# Patient Record
Sex: Female | Born: 1990 | Race: Black or African American | Hispanic: No | Marital: Single | Smoking: Never smoker
Health system: Southern US, Community
[De-identification: ages and names within clinical notes are randomized; demographics above are authoritative.]

## PROBLEM LIST (undated history)

## (undated) ENCOUNTER — Inpatient Hospital Stay: Payer: Self-pay

## (undated) DIAGNOSIS — R569 Unspecified convulsions: Secondary | ICD-10-CM

## (undated) DIAGNOSIS — J45909 Unspecified asthma, uncomplicated: Secondary | ICD-10-CM

## (undated) DIAGNOSIS — E063 Autoimmune thyroiditis: Secondary | ICD-10-CM

## (undated) DIAGNOSIS — F4310 Post-traumatic stress disorder, unspecified: Secondary | ICD-10-CM

## (undated) HISTORY — DX: Autoimmune thyroiditis: E06.3

## (undated) HISTORY — PX: NO PAST SURGERIES: SHX2092

---

## 2016-01-28 ENCOUNTER — Encounter: Payer: Self-pay | Admitting: *Deleted

## 2016-01-28 ENCOUNTER — Emergency Department: Payer: Self-pay

## 2016-01-28 DIAGNOSIS — J45901 Unspecified asthma with (acute) exacerbation: Secondary | ICD-10-CM | POA: Insufficient documentation

## 2016-01-28 MED ORDER — IPRATROPIUM-ALBUTEROL 0.5-2.5 (3) MG/3ML IN SOLN
RESPIRATORY_TRACT | Status: AC
  Filled 2016-01-28: qty 3

## 2016-01-28 MED ORDER — IPRATROPIUM-ALBUTEROL 0.5-2.5 (3) MG/3ML IN SOLN
3.0000 mL | Freq: Once | RESPIRATORY_TRACT | Status: AC
  Administered 2016-01-28: 3 mL via RESPIRATORY_TRACT

## 2016-01-28 NOTE — ED Triage Notes (Signed)
Pt reports she has asthma and lost her inhaler.  Pt has a cough and wheezing for 1 hour.

## 2016-01-28 NOTE — ED Notes (Signed)
Breathing treatment given in triage.  

## 2016-01-29 ENCOUNTER — Emergency Department
Admission: EM | Admit: 2016-01-29 | Discharge: 2016-01-29 | Disposition: A | Discharge status: Home / Self Care | Payer: Self-pay | Attending: Emergency Medicine | Admitting: Emergency Medicine

## 2016-01-29 DIAGNOSIS — J45901 Unspecified asthma with (acute) exacerbation: Secondary | ICD-10-CM

## 2016-01-29 MED ORDER — PREDNISONE 20 MG PO TABS: ORAL_TABLET | ORAL | 0 refills | Status: DC

## 2016-01-29 MED ORDER — PREDNISONE 20 MG PO TABS
60.0000 mg | ORAL_TABLET | Freq: Once | ORAL | Status: AC
  Administered 2016-01-29: 60 mg via ORAL
  Filled 2016-01-29: qty 3

## 2016-01-29 MED ORDER — ALBUTEROL SULFATE HFA 108 (90 BASE) MCG/ACT IN AERS: 2.0000 | INHALATION_SPRAY | RESPIRATORY_TRACT | 0 refills | Status: AC | PRN

## 2016-01-29 MED ORDER — IPRATROPIUM-ALBUTEROL 0.5-2.5 (3) MG/3ML IN SOLN
3.0000 mL | Freq: Once | RESPIRATORY_TRACT | Status: AC
  Administered 2016-01-29: 3 mL via RESPIRATORY_TRACT
  Filled 2016-01-29: qty 3

## 2016-01-29 NOTE — ED Provider Notes (Signed)
[facility] Emergency Department Provider Note   ____________________________________________   First MD Initiated Contact with Patient 01/29/16 [phone]     (approximate)  I have reviewed the triage vital signs and the nursing notes.   HISTORY  Chief Complaint Wheezing    HPI Madison Bruce is a 25 y.o. female who presents to the ED from home with a chief complaint of asthma exacerbation. Patient has a history of asthma, never hospitalized, who lost her inhaler. Reports dry cough and wheezing tonight. Denies associated fever, chills, chest pain, shortness of breath, abdominal pain, nausea, vomiting, diarrhea. Denies recent travel or trauma. Denies OCP use. Nothing makes her symptoms better or worse.   Past medical history Asthma  There are no active problems to display for this patient.   No past surgical history on file.  Prior to Admission medications   Medication Sig Start Date End Date Taking? Authorizing Provider  albuterol (PROVENTIL HFA;VENTOLIN HFA) 108 (90 Base) MCG/ACT inhaler Inhale 2 puffs into the lungs every 4 (four) hours as needed for wheezing or shortness of breath. 01/29/16   Irean Hong, MD  predniSONE (DELTASONE) 20 MG tablet 3 tablets daily x 4 days 01/29/16   Irean Hong, MD    Allergies Review of patient's allergies indicates no known allergies.  No family history on file.  Social History Social History  Substance Use Topics  . Smoking status: Never Smoker  . Smokeless tobacco: Never Used  . Alcohol use No    Review of Systems  Constitutional: No fever/chills. Eyes: No visual changes. ENT: No sore throat. Cardiovascular: Denies chest pain. Respiratory: Positive for nonproductive cough and wheezing. Denies shortness of breath. Gastrointestinal: No abdominal pain.  No nausea, no vomiting.  No diarrhea.  No constipation. Genitourinary: Negative for dysuria. Musculoskeletal: Negative for back pain. Skin: Negative for  rash. Neurological: Negative for headaches, focal weakness or numbness.  10-point ROS otherwise negative.  ____________________________________________   PHYSICAL EXAM:  VITAL SIGNS: ED Triage Vitals  Enc Vitals Group     BP 01/28/16 2320 115/67     Pulse Rate 01/28/16 2320 89     Resp 01/28/16 2320 20     Temp 01/28/16 2320 98.7 F (37.1 C)     Temp Source 01/28/16 2320 Oral     SpO2 01/28/16 2320 98 %     Weight 01/28/16 2322 180 lb (81.6 kg)     Height 01/28/16 2322 5\' 9"  (1.753 m)     Head Circumference --      Peak Flow --      Pain Score 01/28/16 2323 6     Pain Loc --      Pain Edu? --      Excl. in GC? --     Constitutional: Alert and oriented. Well appearing and in no acute distress. Eyes: Conjunctivae are normal. PERRL. EOMI. Head: Atraumatic. Nose: No congestion/rhinnorhea. Mouth/Throat: Mucous membranes are moist.  Oropharynx non-erythematous. Neck: No stridor.   Cardiovascular: Normal rate, regular rhythm. Grossly normal heart sounds.  Good peripheral circulation. Respiratory: Normal respiratory effort.  No retractions. Lungs diminished bibasilarly. No wheezing. Gastrointestinal: Soft and nontender. No distention. No abdominal bruits. No CVA tenderness. Musculoskeletal: No lower extremity tenderness nor edema.  No joint effusions. Neurologic:  Normal speech and language. No gross focal neurologic deficits are appreciated. No gait instability. Skin:  Skin is warm, dry and intact. No rash noted. Psychiatric: Mood and affect are normal. Speech and behavior are normal.  ____________________________________________  LABS (all labs ordered are listed, but only abnormal results are displayed)  Labs Reviewed - No data to display ____________________________________________  EKG  None ____________________________________________  RADIOLOGY  Chest 2 view (view by me, interpreted per Dr. Manus GunningEhinger): No active cardiopulmonary  disease. ____________________________________________   PROCEDURES  Procedure(s) performed: None  Procedures  Critical Care performed: No  ____________________________________________   INITIAL IMPRESSION / ASSESSMENT AND PLAN / ED COURSE  Pertinent labs & imaging results that were available during my care of the patient were reviewed by me and considered in my medical decision making (see chart for details).  25 year old female with a history of asthma who presents with exacerbation. Patient received DuoNeb while in triage. No wheezing and lungs but diminished bibasilarly. Will administer second DuoNeb, add prednisone and reassess.  Clinical Course  Comment By Time  Aeration improved. No wheezing. Room air saturations 100%. Strict return precautions given. Patient's spouse verbalize understanding and agree with plan of care. Irean HongJade J Sung, MD 09/14 [phone]     ____________________________________________   FINAL CLINICAL IMPRESSION(S) / ED DIAGNOSES  Final diagnoses:  Asthma exacerbation      NEW MEDICATIONS STARTED DURING THIS VISIT:  New Prescriptions   ALBUTEROL (PROVENTIL HFA;VENTOLIN HFA) 108 (90 BASE) MCG/ACT INHALER    Inhale 2 puffs into the lungs every 4 (four) hours as needed for wheezing or shortness of breath.   PREDNISONE (DELTASONE) 20 MG TABLET    3 tablets daily x 4 days     Note:  This document was prepared using Dragon voice recognition software and may include unintentional dictation errors.    Irean HongJade J Sung, MD 01/29/16 [phone]

## 2016-01-29 NOTE — Discharge Instructions (Signed)
1. Take prednisone 60 mg daily ×4 days. °2. You may use albuterol inhaler 2 puffs every 4 hours as needed for wheezing. °3. Return to the ER for worsening symptoms, persistent vomiting, difficulty breathing or other concerns. °

## 2016-05-13 ENCOUNTER — Other Ambulatory Visit: Payer: Self-pay | Admitting: Nurse Practitioner

## 2016-05-13 ENCOUNTER — Ambulatory Visit
Admission: RE | Admit: 2016-05-13 | Discharge: 2016-05-13 | Disposition: A | Discharge status: Home / Self Care | Payer: Medicaid Other | Source: Ambulatory Visit | Attending: Nurse Practitioner | Admitting: Nurse Practitioner

## 2016-05-13 DIAGNOSIS — Z3A16 16 weeks gestation of pregnancy: Secondary | ICD-10-CM | POA: Diagnosis not present

## 2016-05-13 DIAGNOSIS — Z3689 Encounter for other specified antenatal screening: Secondary | ICD-10-CM | POA: Diagnosis not present

## 2016-05-13 DIAGNOSIS — Z3402 Encounter for supervision of normal first pregnancy, second trimester: Secondary | ICD-10-CM

## 2016-05-13 LAB — HM PAP SMEAR: HM Pap smear: NEGATIVE

## 2016-05-14 LAB — OB RESULTS CONSOLE GC/CHLAMYDIA
Chlamydia: NEGATIVE
GC PROBE AMP, GENITAL: NEGATIVE

## 2016-05-14 LAB — OB RESULTS CONSOLE HEPATITIS B SURFACE ANTIGEN: Hepatitis B Surface Ag: NEGATIVE

## 2016-05-14 LAB — OB RESULTS CONSOLE HIV ANTIBODY (ROUTINE TESTING): HIV: NONREACTIVE

## 2016-05-14 LAB — OB RESULTS CONSOLE RPR: RPR: NONREACTIVE

## 2016-05-17 NOTE — L&D Delivery Note (Signed)
Delivery Note At 7:01 AM a viable female was delivered via Vaginal, Spontaneous Delivery (Presentation: OA).  APGAR: 9, 9; weight pending .   Placenta status: delivered spontaneously, intaact.  Cord: 3VC with the following complications: none.  Cord pH: n/a  Anesthesia: none  Episiotomy: None Lacerations: None Suture Repair: n/a Est. Blood Loss (mL): 200  Mom to postpartum.  Baby to Couplet care / Skin to Skin.  Called to see patient.  Mom pushed to deliver a viable female infant.  The head followed by shoulders, which delivered without difficulty, and the rest of the body.  No nuchal cord noted.  Baby to mom's chest.  Cord clamped and cut after > 1 min delay.  No cord blood obtained.  Placenta delivered spontaneously, intact, with a 3-vessel cord.  No vaginal, cervical, or perineal lacerations.  All counts correct.  Hemostasis obtained with IV pitocin and fundal massage. EBL 200 mL.     Thomasene MohairStephen Niall Illes 10/25/2016, 7:17 AM

## 2016-07-07 DIAGNOSIS — F324 Major depressive disorder, single episode, in partial remission: Secondary | ICD-10-CM | POA: Insufficient documentation

## 2016-08-03 DIAGNOSIS — Z8751 Personal history of pre-term labor: Secondary | ICD-10-CM | POA: Insufficient documentation

## 2016-09-25 ENCOUNTER — Observation Stay
Admission: EM | Admit: 2016-09-25 | Discharge: 2016-09-25 | Disposition: A | Discharge status: Home / Self Care | Payer: Medicaid Other | Attending: Obstetrics and Gynecology | Admitting: Obstetrics and Gynecology

## 2016-09-25 DIAGNOSIS — R252 Cramp and spasm: Secondary | ICD-10-CM | POA: Insufficient documentation

## 2016-09-25 DIAGNOSIS — Z3A34 34 weeks gestation of pregnancy: Secondary | ICD-10-CM | POA: Insufficient documentation

## 2016-09-25 DIAGNOSIS — O26893 Other specified pregnancy related conditions, third trimester: Principal | ICD-10-CM | POA: Insufficient documentation

## 2016-09-25 HISTORY — DX: Unspecified convulsions: R56.9

## 2016-09-25 HISTORY — DX: Post-traumatic stress disorder, unspecified: F43.10

## 2016-09-25 LAB — URINALYSIS, ROUTINE W REFLEX MICROSCOPIC
BILIRUBIN URINE: NEGATIVE
Glucose, UA: NEGATIVE mg/dL
HGB URINE DIPSTICK: NEGATIVE
KETONES UR: NEGATIVE mg/dL
Leukocytes, UA: NEGATIVE
NITRITE: NEGATIVE
PH: 6 (ref 5.0–8.0)
Protein, ur: NEGATIVE mg/dL
Specific Gravity, Urine: 1.012 (ref 1.005–1.030)

## 2016-09-25 LAB — URINE DRUG SCREEN, QUALITATIVE (ARMC ONLY)
AMPHETAMINES, UR SCREEN: NOT DETECTED
BARBITURATES, UR SCREEN: NOT DETECTED
Benzodiazepine, Ur Scrn: NOT DETECTED
CANNABINOID 50 NG, UR ~~LOC~~: NOT DETECTED
COCAINE METABOLITE, UR ~~LOC~~: NOT DETECTED
MDMA (ECSTASY) UR SCREEN: NOT DETECTED
METHADONE SCREEN, URINE: NOT DETECTED
Opiate, Ur Screen: NOT DETECTED
PHENCYCLIDINE (PCP) UR S: NOT DETECTED
TRICYCLIC, UR SCREEN: NOT DETECTED

## 2016-09-25 MED ORDER — ACETAMINOPHEN 325 MG PO TABS: 650.0000 mg | ORAL_TABLET | ORAL | Status: DC | PRN

## 2016-09-25 NOTE — Discharge Summary (Signed)
See final progress note. 

## 2016-09-25 NOTE — OB Triage Note (Signed)
Patient came in for observation for abdominal cramping and lower back pain that started this evening around 2000. Patient denies uterine contractions at this time. Patient states she has been feeling the baby move fine. Patient denies leaking of fluid, denies vaginal bleeding and spotting. Vital signs stable and patient afebrile. FHR baseline 155 with moderate variability with accelerations 15 x 15 and no decelerations. Husband at bedside. Will continue to monitor.

## 2016-09-25 NOTE — Discharge Summary (Signed)
Patient discharged with instructions on follow up appointment, fetal kick count, labor precautions,  and when to seek medical attention. Patient ambulatory at discharge with steady gait and no complaints. Patient verbalized understanding of discharge instructions. Patient discharged with significant other.

## 2016-09-25 NOTE — Final Progress Note (Signed)
Physician Final Progress Note  Patient ID: Madison Bruce MRN: [id number] DOB/AGE: 11-14-1990 26 y.o.  Admit date: 09/25/2016 Admitting provider: Vena Austria, MD Discharge date: 09/25/2016   Admission Diagnoses: Cramping  Discharge Diagnoses:  Active Problems:   Labor and delivery indication for care or intervention  26 year old G3P1102 at [redacted]w[redacted]d, history of prior PTB at 35 weeks, presenting with cramping.  +FM, no LOF, no vaginal bleeding.  Irregular contractions on tocometer.  Patient slightly tachy 90-100's.  UA and UDS sent.  Encouraged po hydration.  Does have a history of thyroid disease currently on synthroid.    Consults: None diagnostics:[id number]} Significant Findings/ Diagnostic Studies:  Results for orders placed or performed during the hospital encounter of 09/25/16 (from the past 24 hour(s))  Urine Drug Screen, Qualitative ([facility] only)     Status: None   Collection Time: 09/25/16  9:54 PM  Result Value Ref Range   Tricyclic, Ur Screen NONE DETECTED NONE DETECTED   Amphetamines, Ur Screen NONE DETECTED NONE DETECTED   MDMA (Ecstasy)Ur Screen NONE DETECTED NONE DETECTED   Cocaine Metabolite,Ur [place] NONE DETECTED NONE DETECTED   Opiate, Ur Screen NONE DETECTED NONE DETECTED   Phencyclidine (PCP) Ur S NONE DETECTED NONE DETECTED   Cannabinoid 50 Ng, Ur [place] NONE DETECTED NONE DETECTED   Barbiturates, Ur Screen NONE DETECTED NONE DETECTED   Benzodiazepine, Ur Scrn NONE DETECTED NONE DETECTED   Methadone Scn, Ur NONE DETECTED NONE DETECTED  Urinalysis, Routine w reflex microscopic     Status: Abnormal   Collection Time: 09/25/16  9:54 PM  Result Value Ref Range   Color, Urine YELLOW (A) YELLOW   APPearance CLOUDY (A) CLEAR   Specific Gravity, Urine 1.012 1.005 - 1.030   pH 6.0 5.0 - 8.0   Glucose, UA NEGATIVE NEGATIVE mg/dL   Hgb urine dipstick NEGATIVE NEGATIVE   Bilirubin Urine NEGATIVE NEGATIVE   Ketones, ur NEGATIVE NEGATIVE mg/dL   Protein, ur NEGATIVE NEGATIVE mg/dL   Nitrite NEGATIVE NEGATIVE   Leukocytes, UA NEGATIVE NEGATIVE     Procedures: NST Baseline: 150 Variability: moderate Accelerations: present Decelerations: absent Tocometry: irregular, every 6-10 minutes The patient was monitored for 30 minutes, fetal heart rate tracing was deemed reactive, category I tracing,   Discharge Condition: good  Disposition: 01-Home or Self Care  Diet: Regular diet  Discharge Activity: Activity as tolerated  Discharge Instructions    Discharge activity:  No Restrictions    Complete by:  As directed    Discharge diet:  No restrictions    Complete by:  As directed    Fetal Kick Count:  Lie on our left side for one hour after a meal, and count the number of times your baby kicks.  If it is less than 5 times, get up, move around and drink some juice.  Repeat the test 30 minutes later.  If it is still less than 5 kicks in an hour, notify your doctor.    Complete by:  As directed    LABOR:  When conractions begin, you should start to time them from the beginning of one contraction to the beginning  of the next.  When contractions are 5 - 10 minutes apart or less and have been regular for at least an hour, you should call your health care provider.    Complete by:  As directed    No sexual activity restrictions    Complete by:  As directed    Notify physician for bleeding from the vagina  Complete by:  As directed    Notify physician for blurring of vision or spots before the eyes    Complete by:  As directed    Notify physician for chills or fever    Complete by:  As directed    Notify physician for fainting spells, "black outs" or loss of consciousness    Complete by:  As directed    Notify physician for increase in vaginal discharge    Complete by:  As directed    Notify physician for leaking of fluid    Complete by:  As directed    Notify physician for pain or burning when urinating    Complete by:  As directed    Notify physician for pelvic  pressure (sudden increase)    Complete by:  As directed    Notify physician for severe or continued nausea or vomiting    Complete by:  As directed    Notify physician for sudden gushing of fluid from the vagina (with or without continued leaking)    Complete by:  As directed    Notify physician for sudden, constant, or occasional abdominal pain    Complete by:  As directed    Notify physician if baby moving less than usual    Complete by:  As directed      Allergies as of 09/25/2016   No Known Allergies     Medication List    TAKE these medications   albuterol 108 (90 Base) MCG/ACT inhaler Commonly known as:  PROVENTIL HFA;VENTOLIN HFA Inhale 2 puffs into the lungs every 4 (four) hours as needed for wheezing or shortness of breath.   ferrous sulfate 325 (65 FE) MG tablet Take 325 mg by mouth daily with breakfast.   predniSONE 20 MG tablet Commonly known as:  DELTASONE 3 tablets daily x 4 days        Total time spent taking care of this patient: 20 minutes  Signed: Vena Austriandreas Kyshawn Teal 09/25/2016, 11:05 PM

## 2016-09-30 LAB — OB RESULTS CONSOLE GBS: STREP GROUP B AG: NEGATIVE

## 2016-10-08 ENCOUNTER — Observation Stay
Admission: EM | Admit: 2016-10-08 | Discharge: 2016-10-09 | Disposition: A | Discharge status: Home / Self Care | Payer: Medicaid Other | Attending: Obstetrics and Gynecology | Admitting: Obstetrics and Gynecology

## 2016-10-08 DIAGNOSIS — O368190 Decreased fetal movements, unspecified trimester, not applicable or unspecified: Secondary | ICD-10-CM | POA: Diagnosis present

## 2016-10-08 DIAGNOSIS — O471 False labor at or after 37 completed weeks of gestation: Secondary | ICD-10-CM | POA: Insufficient documentation

## 2016-10-08 DIAGNOSIS — O99283 Endocrine, nutritional and metabolic diseases complicating pregnancy, third trimester: Secondary | ICD-10-CM | POA: Insufficient documentation

## 2016-10-08 DIAGNOSIS — O368130 Decreased fetal movements, third trimester, not applicable or unspecified: Principal | ICD-10-CM | POA: Insufficient documentation

## 2016-10-08 DIAGNOSIS — Z3A37 37 weeks gestation of pregnancy: Secondary | ICD-10-CM | POA: Insufficient documentation

## 2016-10-08 DIAGNOSIS — F4310 Post-traumatic stress disorder, unspecified: Secondary | ICD-10-CM | POA: Insufficient documentation

## 2016-10-08 DIAGNOSIS — O99343 Other mental disorders complicating pregnancy, third trimester: Secondary | ICD-10-CM | POA: Insufficient documentation

## 2016-10-08 DIAGNOSIS — G40909 Epilepsy, unspecified, not intractable, without status epilepticus: Secondary | ICD-10-CM | POA: Insufficient documentation

## 2016-10-08 DIAGNOSIS — E039 Hypothyroidism, unspecified: Secondary | ICD-10-CM | POA: Insufficient documentation

## 2016-10-08 DIAGNOSIS — O0933 Supervision of pregnancy with insufficient antenatal care, third trimester: Secondary | ICD-10-CM | POA: Insufficient documentation

## 2016-10-08 DIAGNOSIS — Z79899 Other long term (current) drug therapy: Secondary | ICD-10-CM | POA: Insufficient documentation

## 2016-10-08 DIAGNOSIS — O99353 Diseases of the nervous system complicating pregnancy, third trimester: Secondary | ICD-10-CM | POA: Insufficient documentation

## 2016-10-08 NOTE — OB Triage Note (Signed)
Patient c/o of decrease fetal movement this this morning. Patient states that she drank water throughout the day to help increase fetal movement. Patient  perform fetal kick count at 2100 and felt baby 5 times. Patient states that she was waiting until bed time, which she states is his most active time, to determine if she felt baby more. Patients states that tonight baby is less active than usual. Patient placed on fetal monitors. Will continue to assess.

## 2016-10-09 DIAGNOSIS — O99343 Other mental disorders complicating pregnancy, third trimester: Secondary | ICD-10-CM | POA: Diagnosis not present

## 2016-10-09 DIAGNOSIS — G40909 Epilepsy, unspecified, not intractable, without status epilepticus: Secondary | ICD-10-CM | POA: Diagnosis not present

## 2016-10-09 DIAGNOSIS — E039 Hypothyroidism, unspecified: Secondary | ICD-10-CM | POA: Diagnosis not present

## 2016-10-09 DIAGNOSIS — O0933 Supervision of pregnancy with insufficient antenatal care, third trimester: Secondary | ICD-10-CM | POA: Diagnosis not present

## 2016-10-09 DIAGNOSIS — Z79899 Other long term (current) drug therapy: Secondary | ICD-10-CM | POA: Diagnosis not present

## 2016-10-09 DIAGNOSIS — Z3A37 37 weeks gestation of pregnancy: Secondary | ICD-10-CM | POA: Diagnosis not present

## 2016-10-09 DIAGNOSIS — O99283 Endocrine, nutritional and metabolic diseases complicating pregnancy, third trimester: Secondary | ICD-10-CM | POA: Diagnosis not present

## 2016-10-09 DIAGNOSIS — O368130 Decreased fetal movements, third trimester, not applicable or unspecified: Secondary | ICD-10-CM | POA: Diagnosis not present

## 2016-10-09 DIAGNOSIS — O99353 Diseases of the nervous system complicating pregnancy, third trimester: Secondary | ICD-10-CM | POA: Diagnosis not present

## 2016-10-09 DIAGNOSIS — F4310 Post-traumatic stress disorder, unspecified: Secondary | ICD-10-CM | POA: Diagnosis not present

## 2016-10-09 DIAGNOSIS — O471 False labor at or after 37 completed weeks of gestation: Secondary | ICD-10-CM | POA: Diagnosis not present

## 2016-10-09 DIAGNOSIS — O368190 Decreased fetal movements, unspecified trimester, not applicable or unspecified: Secondary | ICD-10-CM | POA: Diagnosis present

## 2016-10-09 NOTE — Final Progress Note (Signed)
Physician Final Progress Note  Patient ID: Madison Bruce MRN: [id number] DOB/AGE: [birth year] 27 y.o.  Admit date: 10/08/2016 Admitting provider: Conard Novak, MD Discharge date: 10/09/2016   Admission Diagnoses:  1) intrauterine pregnancy at [redacted]w[redacted]d 2) decreased fetal movement  Discharge Diagnoses:  1) intrauterine pregnancy at [redacted]w[redacted]d 2) decreased fetal movement   History of Present Illness: The patient is a 26 y.o. female [id number] at [redacted]w[redacted]d who presents for decreased fetal movement since yesterday. She notes occasional contractions, no vb. No LOF. No other acute concerns. Pregnancy complicated by psychiatric factors and patient-reported hypothyroidism, late entry into prenatal care.  Hospital Course: admitted for observation. Reactive NST after 2.5 hours of observation.  Patient discharged in stable condition with strict precautions.  Past Medical History:  Diagnosis Date  . PTSD (post-traumatic stress disorder)   . Seizures (HCC)    Past Surgical history: none   No current facility-administered medications on file prior to encounter.    Current Outpatient Prescriptions on File Prior to Encounter  Medication Sig Dispense Refill  . albuterol (PROVENTIL HFA;VENTOLIN HFA) 108 (90 Base) MCG/ACT inhaler Inhale 2 puffs into the lungs every 4 (four) hours as needed for wheezing or shortness of breath. 1 Inhaler 0  . ferrous sulfate 325 (65 FE) MG tablet Take 325 mg by mouth daily with breakfast.    . predniSONE (DELTASONE) 20 MG tablet 3 tablets daily x 4 days (Patient not taking: Reported on 10/08/2016) 12 tablet 0    No Known Allergies  Social History   Social History  . Marital status: Single    Spouse name: N/A  . Number of children: N/A  . Years of education: N/A   Occupational History  . Not on file.   Social History Main Topics  . Smoking status: Never Smoker  . Smokeless tobacco: Never Used  . Alcohol use No  . Drug use: No  . Sexual activity: Yes   Other Topics  Concern  . Not on file   Social History Narrative  . No narrative on file   Family History: denies gyn cancer  Physical Exam: BP (!) 107/59 (BP Location: Left Arm)   Pulse 92   Temp 98.6 F (37 C) (Oral)   Resp 18   LMP 01/25/2016 (Exact Date)   Physical Exam  Constitutional: She is oriented to person, place, and time. She appears well-developed and well-nourished. No distress.  HENT:  Head: Normocephalic and atraumatic.  Eyes: Conjunctivae are normal. No scleral icterus.  Neck: Normal range of motion. Neck supple.  Cardiovascular: Normal rate, regular rhythm and normal heart sounds.   Pulmonary/Chest: Effort normal and breath sounds normal. She has no wheezes.  Abdominal: Soft. She exhibits no distension. There is no tenderness. There is no guarding. Hernia confirmed negative in the right inguinal area and confirmed negative in the left inguinal area.  Gravid, NT  Genitourinary: Pelvic exam was performed with patient supine. There is no rash, tenderness or lesion on the right labia. There is no rash, tenderness or lesion on the left labia.  Genitourinary Comments: deferred  Musculoskeletal: Normal range of motion.  Lymphadenopathy:       Right: No inguinal adenopathy present.       Left: No inguinal adenopathy present.  Neurological: She is alert and oriented to person, place, and time.  Skin: Skin is warm and dry. No rash noted.  Psychiatric: She has a normal mood and affect. Her behavior is normal. Judgment normal.     Consults: None  Significant Findings/ Diagnostic Studies: n/a  Procedures: NST Baseline FHR: 150 beats/min Variability: moderate Accelerations: present Decelerations: absent Tocometry: infrequent  Interpretation:  INDICATIONS: decreased fetal movement RESULTS:  A NST procedure was performed with FHR monitoring and a normal baseline established, appropriate time of 20-40 minutes of evaluation, and accels >2 seen w 15x15 characteristics.  Results show  a REACTIVE NST.    Discharge Condition: stable  Disposition: 01-Home or Self Care  Diet: Regular diet  Discharge Activity: Activity as tolerated   Allergies as of 10/09/2016   No Known Allergies     Medication List    STOP taking these medications   predniSONE 20 MG tablet Commonly known as:  DELTASONE     TAKE these medications   albuterol 108 (90 Base) MCG/ACT inhaler Commonly known as:  PROVENTIL HFA;VENTOLIN HFA Inhale 2 puffs into the lungs every 4 (four) hours as needed for wheezing or shortness of breath.   ferrous sulfate 325 (65 FE) MG tablet Take 325 mg by mouth daily with breakfast.      Follow-up Information    Department, [facility] Follow up on 10/14/2016.   Contact information: [address]  [place] [id number] [phone]           Total time spent taking care of this patient: 25 minutes  Signed: Thomasene MohairStephen Nikodem Leadbetter, MD  10/09/2016, 1:16 AM

## 2016-10-09 NOTE — Discharge Instructions (Signed)
Fetal Movement Counts  Patient Name: ________________________________________________ Patient Due Date: ____________________  What is a fetal movement count?  A fetal movement count is the number of times that you feel your baby move during a certain amount of time. This may also be called a fetal kick count. A fetal movement count is recommended for every pregnant woman. You may be asked to start counting fetal movements as early as week 28 of your pregnancy.  Pay attention to when your baby is most active. You may notice your baby's sleep and wake cycles. You may also notice things that make your baby move more. You should do a fetal movement count:  · When your baby is normally most active.  · At the same time each day.    A good time to count movements is while you are resting, after having something to eat and drink.  How do I count fetal movements?  1. Find a quiet, comfortable area. Sit, or lie down on your side.  2. Write down the date, the start time and stop time, and the number of movements that you felt between those two times. Take this information with you to your health care visits.  3. For 2 hours, count kicks, flutters, swishes, rolls, and jabs. You should feel at least 10 movements during 2 hours.  4. You may stop counting after you have felt 10 movements.  5. If you do not feel 10 movements in 2 hours, have something to eat and drink. Then, keep resting and counting for 1 hour. If you feel at least 4 movements during that hour, you may stop counting.  Contact a health care provider if:  · You feel fewer than 4 movements in 2 hours.  · Your baby is not moving like he or she usually does.  Date: ____________ Start time: ____________ Stop time: ____________ Movements: ____________  Date: ____________ Start time: ____________ Stop time: ____________ Movements: ____________  Date: ____________ Start time: ____________ Stop time: ____________ Movements: ____________  Date: ____________ Start time:  ____________ Stop time: ____________ Movements: ____________  Date: ____________ Start time: ____________ Stop time: ____________ Movements: ____________  Date: ____________ Start time: ____________ Stop time: ____________ Movements: ____________  Date: ____________ Start time: ____________ Stop time: ____________ Movements: ____________  Date: ____________ Start time: ____________ Stop time: ____________ Movements: ____________  Date: ____________ Start time: ____________ Stop time: ____________ Movements: ____________  This information is not intended to replace advice given to you by your health care provider. Make sure you discuss any questions you have with your health care provider.  Document Released: 06/02/2006 Document Revised: 12/31/2015 Document Reviewed: 06/12/2015  Elsevier Interactive Patient Education © 2017 [employer]

## 2016-10-21 ENCOUNTER — Observation Stay
Admission: EM | Admit: 2016-10-21 | Discharge: 2016-10-21 | Disposition: A | Discharge status: Home / Self Care | Payer: Medicaid Other | Attending: Obstetrics & Gynecology | Admitting: Obstetrics & Gynecology

## 2016-10-21 DIAGNOSIS — H538 Other visual disturbances: Secondary | ICD-10-CM | POA: Insufficient documentation

## 2016-10-21 DIAGNOSIS — R51 Headache: Secondary | ICD-10-CM | POA: Diagnosis present

## 2016-10-21 DIAGNOSIS — O368190 Decreased fetal movements, unspecified trimester, not applicable or unspecified: Secondary | ICD-10-CM | POA: Diagnosis present

## 2016-10-21 HISTORY — DX: Unspecified asthma, uncomplicated: J45.909

## 2016-10-21 LAB — URINALYSIS, ROUTINE W REFLEX MICROSCOPIC
BILIRUBIN URINE: NEGATIVE
Glucose, UA: NEGATIVE mg/dL
Hgb urine dipstick: NEGATIVE
Ketones, ur: NEGATIVE mg/dL
Nitrite: NEGATIVE
PROTEIN: NEGATIVE mg/dL
SPECIFIC GRAVITY, URINE: 1.012 (ref 1.005–1.030)
pH: 7 (ref 5.0–8.0)

## 2016-10-21 MED ORDER — ACETAMINOPHEN 325 MG PO TABS: 650.0000 mg | ORAL_TABLET | Freq: Four times a day (QID) | ORAL | Status: DC | PRN

## 2016-10-21 NOTE — OB Triage Note (Signed)
Pt presents c/o headache unrelieved by tylenol, swelling,  intermittent blurred vision, and decreased fetal movement. This all started last night and got worse this morning. Denies NVD, bleeding or LOF. Reports very minimal fetal movement. 1+ pitting edema in lower extremities, 1+ reflexes, 2 beats of clonus, lungs clear. Will continue to monitor

## 2016-10-21 NOTE — Progress Notes (Addendum)
Went over and Reviewed discharge instructions with patient. Discussed signs and symtoms of pre-eclampsia, as well as labor and when to return to get checked out. Gave opportunity for questions. Discussed fetal kick counts with patient. Pt voiced understanding. Pt discharged home with husband and children.

## 2016-10-23 NOTE — Discharge Summary (Signed)
Patient presented for evaluation of labor and headache.  Patient had cervical exam by RN and this was reported to me. No signs or symptoms of preeclampsia with her evaluation. I reviewed her vital signs and fetal tracing, both of which were reassuring.  Patient was discharge as she was not laboring.

## 2016-10-25 ENCOUNTER — Inpatient Hospital Stay
Admission: EM | Admit: 2016-10-25 | Discharge: 2016-10-26 | DRG: 775 | Disposition: A | Discharge status: Home / Self Care | Payer: Medicaid Other | Attending: Obstetrics and Gynecology | Admitting: Obstetrics and Gynecology

## 2016-10-25 DIAGNOSIS — Z3A40 40 weeks gestation of pregnancy: Secondary | ICD-10-CM

## 2016-10-25 DIAGNOSIS — J45909 Unspecified asthma, uncomplicated: Secondary | ICD-10-CM | POA: Diagnosis present

## 2016-10-25 DIAGNOSIS — O9902 Anemia complicating childbirth: Principal | ICD-10-CM | POA: Diagnosis present

## 2016-10-25 DIAGNOSIS — D649 Anemia, unspecified: Secondary | ICD-10-CM | POA: Diagnosis present

## 2016-10-25 DIAGNOSIS — O9952 Diseases of the respiratory system complicating childbirth: Secondary | ICD-10-CM | POA: Diagnosis present

## 2016-10-25 DIAGNOSIS — Z3493 Encounter for supervision of normal pregnancy, unspecified, third trimester: Secondary | ICD-10-CM | POA: Diagnosis present

## 2016-10-25 LAB — URINE DRUG SCREEN, QUALITATIVE (ARMC ONLY)
AMPHETAMINES, UR SCREEN: NOT DETECTED
BENZODIAZEPINE, UR SCRN: NOT DETECTED
Barbiturates, Ur Screen: NOT DETECTED
Cannabinoid 50 Ng, Ur ~~LOC~~: NOT DETECTED
Cocaine Metabolite,Ur ~~LOC~~: NOT DETECTED
MDMA (ECSTASY) UR SCREEN: NOT DETECTED
Methadone Scn, Ur: NOT DETECTED
Opiate, Ur Screen: NOT DETECTED
PHENCYCLIDINE (PCP) UR S: NOT DETECTED
TRICYCLIC, UR SCREEN: NOT DETECTED

## 2016-10-25 LAB — TYPE AND SCREEN
ABO/RH(D): O POS
ANTIBODY SCREEN: NEGATIVE

## 2016-10-25 LAB — CBC
HEMATOCRIT: 33.9 % — AB (ref 35.0–47.0)
Hemoglobin: 11.2 g/dL — ABNORMAL LOW (ref 12.0–16.0)
MCH: 27.2 pg (ref 26.0–34.0)
MCHC: 33.2 g/dL (ref 32.0–36.0)
MCV: 82 fL (ref 80.0–100.0)
Platelets: 137 10*3/uL — ABNORMAL LOW (ref 150–440)
RBC: 4.13 MIL/uL (ref 3.80–5.20)
RDW: 18.6 % — AB (ref 11.5–14.5)
WBC: 10.6 10*3/uL (ref 3.6–11.0)

## 2016-10-25 LAB — OB RESULTS CONSOLE VARICELLA ZOSTER ANTIBODY, IGG: VARICELLA IGG: IMMUNE

## 2016-10-25 LAB — CHLAMYDIA/NGC RT PCR (ARMC ONLY)
Chlamydia Tr: NOT DETECTED
N gonorrhoeae: NOT DETECTED

## 2016-10-25 LAB — OB RESULTS CONSOLE RUBELLA ANTIBODY, IGM: Rubella: IMMUNE

## 2016-10-25 MED ORDER — OXYTOCIN 10 UNIT/ML IJ SOLN: 10.0000 [IU] | Freq: Once | INTRAMUSCULAR | Status: DC

## 2016-10-25 MED ORDER — OXYTOCIN 40 UNITS IN LACTATED RINGERS INFUSION - SIMPLE MED: 2.5000 [IU]/h | INTRAVENOUS | Status: DC

## 2016-10-25 MED ORDER — OXYTOCIN BOLUS FROM INFUSION: 500.0000 mL | Freq: Once | INTRAVENOUS | Status: DC

## 2016-10-25 MED ORDER — ONDANSETRON HCL 4 MG/2ML IJ SOLN: 4.0000 mg | Freq: Four times a day (QID) | INTRAMUSCULAR | Status: DC | PRN

## 2016-10-25 MED ORDER — LACTATED RINGERS IV SOLN: 500.0000 mL | INTRAVENOUS | Status: DC | PRN

## 2016-10-25 MED ORDER — FERROUS SULFATE 325 (65 FE) MG PO TABS
325.0000 mg | ORAL_TABLET | Freq: Two times a day (BID) | ORAL | Status: DC
  Administered 2016-10-25 – 2016-10-26 (×2): 325 mg via ORAL
  Filled 2016-10-25 (×2): qty 1

## 2016-10-25 MED ORDER — LACTATED RINGERS IV SOLN: INTRAVENOUS | Status: DC

## 2016-10-25 MED ORDER — LIDOCAINE HCL (PF) 1 % IJ SOLN
INTRAMUSCULAR | Status: AC
  Filled 2016-10-25: qty 30

## 2016-10-25 MED ORDER — DIPHENHYDRAMINE HCL 25 MG PO CAPS: 25.0000 mg | ORAL_CAPSULE | Freq: Four times a day (QID) | ORAL | Status: DC | PRN

## 2016-10-25 MED ORDER — PRENATAL MULTIVITAMIN CH
1.0000 | ORAL_TABLET | Freq: Every day | ORAL | Status: DC
  Administered 2016-10-25 – 2016-10-26 (×2): 1 via ORAL
  Filled 2016-10-25 (×4): qty 1

## 2016-10-25 MED ORDER — DIBUCAINE 1 % RE OINT: 1.0000 "application " | TOPICAL_OINTMENT | RECTAL | Status: DC | PRN

## 2016-10-25 MED ORDER — IBUPROFEN 600 MG PO TABS
600.0000 mg | ORAL_TABLET | Freq: Four times a day (QID) | ORAL | Status: DC
  Administered 2016-10-25 – 2016-10-26 (×3): 600 mg via ORAL
  Filled 2016-10-25 (×4): qty 1

## 2016-10-25 MED ORDER — ONDANSETRON HCL 4 MG PO TABS: 4.0000 mg | ORAL_TABLET | ORAL | Status: DC | PRN

## 2016-10-25 MED ORDER — OXYTOCIN 40 UNITS IN LACTATED RINGERS INFUSION - SIMPLE MED
INTRAVENOUS | Status: AC
  Filled 2016-10-25: qty 1000

## 2016-10-25 MED ORDER — ACETAMINOPHEN 325 MG PO TABS: 650.0000 mg | ORAL_TABLET | ORAL | Status: DC | PRN

## 2016-10-25 MED ORDER — WITCH HAZEL-GLYCERIN EX PADS: 1.0000 "application " | MEDICATED_PAD | CUTANEOUS | Status: DC | PRN

## 2016-10-25 MED ORDER — MISOPROSTOL 200 MCG PO TABS
ORAL_TABLET | ORAL | Status: AC
  Filled 2016-10-25: qty 4

## 2016-10-25 MED ORDER — SIMETHICONE 80 MG PO CHEW: 80.0000 mg | CHEWABLE_TABLET | ORAL | Status: DC | PRN

## 2016-10-25 MED ORDER — OXYTOCIN 10 UNIT/ML IJ SOLN
INTRAMUSCULAR | Status: AC
  Filled 2016-10-25: qty 2

## 2016-10-25 MED ORDER — SOD CITRATE-CITRIC ACID 500-334 MG/5ML PO SOLN: 30.0000 mL | ORAL | Status: DC | PRN

## 2016-10-25 MED ORDER — SENNOSIDES-DOCUSATE SODIUM 8.6-50 MG PO TABS
2.0000 | ORAL_TABLET | ORAL | Status: DC
  Administered 2016-10-26: 2 via ORAL
  Filled 2016-10-25: qty 2

## 2016-10-25 MED ORDER — LIDOCAINE HCL (PF) 1 % IJ SOLN: 30.0000 mL | INTRAMUSCULAR | Status: DC | PRN

## 2016-10-25 MED ORDER — COCONUT OIL OIL
1.0000 "application " | TOPICAL_OIL | Status: DC | PRN
  Administered 2016-10-25: 1 via TOPICAL
  Filled 2016-10-25: qty 120

## 2016-10-25 MED ORDER — AMMONIA AROMATIC IN INHA
RESPIRATORY_TRACT | Status: AC
  Filled 2016-10-25: qty 10

## 2016-10-25 MED ORDER — PRENATAL MULTIVITAMIN CH: 1.0000 | ORAL_TABLET | Freq: Every day | ORAL | Status: DC

## 2016-10-25 MED ORDER — BENZOCAINE-MENTHOL 20-0.5 % EX AERO
1.0000 "application " | INHALATION_SPRAY | CUTANEOUS | Status: DC | PRN
  Administered 2016-10-25: 1 via TOPICAL
  Filled 2016-10-25: qty 56

## 2016-10-25 MED ORDER — ONDANSETRON HCL 4 MG/2ML IJ SOLN: 4.0000 mg | INTRAMUSCULAR | Status: DC | PRN
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
## 2016-10-25 NOTE — Discharge Summary (Signed)
OB Discharge Summary     Patient Name: Madison Bruce DOB: 01-18-1991 MRN: [id number]  Date of admission: 10/25/2016 Delivering MD: Thomasene Mohair, MD  Date of Delivery: 10/25/2016  Date of discharge: 10/26/2016 Admitting diagnosis: 41 wks preg contractions Intrauterine pregnancy: [redacted]w[redacted]d     Secondary diagnosis: None     Discharge diagnosis: Term Pregnancy Delivered  , Chronic anemia                                                                                            Post partum procedures:none  Augmentation: none  Complications: None  Hospital course:  Onset of Labor With Vaginal Delivery     26 y.o. yo [id number] at [redacted]w[redacted]d was admitted in Active Labor on 10/25/2016. Patient had an uncomplicated labor course as follows:  Membrane Rupture Time/Date:   ,    Intrapartum Procedures: Episiotomy: None [1]                                         Lacerations:  None [1]  Patient had a delivery of a Viable infant. 10/25/2016  Information for the patient's newborn:  Buna, Cuppett [[id number]]  Delivery Method: Vag-Spont    Pateint had an uncomplicated postpartum course.  She is ambulating, tolerating a regular diet, and urinating well. Patient is discharged home in stable condition on 10/26/16.   Physical exam  Vitals:   10/25/16 2005 10/25/16 2346 10/26/16 0332 10/26/16 0820  BP: 110/60 (!) 119/57 (!) 114/49 (!) 117/57  Pulse: 99 (!) 115 100 (!) 104  Resp: 14 18 18 18   Temp: 98.4 F (36.9 C) 99 F (37.2 C) 98.6 F (37 C) 98 F (36.7 C)  TempSrc: Oral Oral Oral Oral  SpO2: 99% 98% 100% 99%  Weight:      Height:       General: alert, cooperative and no distress, breastfeeding Lochia: appropriate Uterine Fundus: firm/ U-2/ ML/ NT Incision: N/A DVT Evaluation: No evidence of DVT seen on physical exam.  Labs:  Results for orders placed or performed during the hospital encounter of 10/25/16 (from the past 24 hour(s))  CBC     Status: Abnormal   Collection Time: 10/26/16  4:59 AM   Result Value Ref Range   WBC 11.6 (H) 3.6 - 11.0 K/uL   RBC 3.53 (L) 3.80 - 5.20 MIL/uL   Hemoglobin 9.3 (L) 12.0 - 16.0 g/dL   HCT 13.0 (L) 86.5 - 78.4 %   MCV 81.2 80.0 - 100.0 fL   MCH 26.3 26.0 - 34.0 pg   MCHC 32.4 32.0 - 36.0 g/dL   RDW 69.6 (H) 29.5 - 28.4 %   Platelets 121 (L) 150 - 440 K/uL   O POS/ RI/ VI/ GBS negative  Discharge instruction: per After Visit Summary.  Medications:  Allergies as of 10/26/2016   No Known Allergies     Medication List    TAKE these medications   albuterol 108 (90 Base) MCG/ACT inhaler Commonly known as:  PROVENTIL HFA;VENTOLIN HFA Inhale 2 puffs into  the lungs every 4 (four) hours as needed for wheezing or shortness of breath.   ferrous sulfate 325 (65 FE) MG tablet Take 325 mg by mouth daily with breakfast.   ibuprofen 600 MG tablet Commonly known as:  ADVIL,MOTRIN Take 1 tablet (600 mg total) by mouth every 6 (six) hours as needed for headache, mild pain, moderate pain or cramping.   multivitamin-prenatal 27-0.8 MG Tabs tablet Take 1 tablet by mouth daily at 12 noon.       Diet: routine diet  Activity: Advance as tolerated. Pelvic rest for 6 weeks.   Outpatient follow up: Follow-up Information    Department, [facility] Follow up in 2 week(s).   Why:  postpartum depression screen Contact information: [address] [place] [id number] [phone]             Postpartum contraception: IUD Mirena Rhogam Given postpartum: no Rubella vaccine given postpartum: no Varicella vaccine given postpartum: no TDaP given antepartum or postpartum: AP  Newborn Data: Live born female / [place] Birth Weight:  7#8.6oz APGAR: 9, 9   Baby Feeding: Breast  Disposition:home with mother  SIGNED: Farrel ConnersColleen Manuelita Moxon, CNM
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
## 2016-10-26 LAB — CBC
HCT: 28.7 % — ABNORMAL LOW (ref 35.0–47.0)
HEMOGLOBIN: 9.3 g/dL — AB (ref 12.0–16.0)
MCH: 26.3 pg (ref 26.0–34.0)
MCHC: 32.4 g/dL (ref 32.0–36.0)
MCV: 81.2 fL (ref 80.0–100.0)
Platelets: 121 10*3/uL — ABNORMAL LOW (ref 150–440)
RBC: 3.53 MIL/uL — AB (ref 3.80–5.20)
RDW: 18.3 % — ABNORMAL HIGH (ref 11.5–14.5)
WBC: 11.6 10*3/uL — AB (ref 3.6–11.0)

## 2016-10-26 LAB — RPR: RPR Ser Ql: NONREACTIVE

## 2016-10-26 MED ORDER — IBUPROFEN 600 MG PO TABS: 600.0000 mg | ORAL_TABLET | Freq: Four times a day (QID) | ORAL | 0 refills | Status: AC | PRN

## 2016-10-26 NOTE — Progress Notes (Signed)
Discharge inst reviewed with pt and FOB.  Verbalizes u/o.

## 2016-10-26 NOTE — Progress Notes (Signed)
Clinical Social Worker (CSW) contacted [place] Child Protective Services (CPS) and was told the CPS report was screened out.  CSW contacted [place] CPS and spoke with Ann [phone] CPS program manager who reported that [place] CPS report was closed on 10/17/15 and now the case is under [place] foster care. CSW also confirmed that [place] foster care has no record of patient and it is under [place]. CSW spoke to Sharon Flood [phone] [place] Foster Care Program manager and made her aware of above. Per Sharon at this time [place] has no legal jurisdiction over patient's third child and it is okay to discharge mother and infant home today. CSW made Sharon aware that [place] CPS did not accept the CPS report. Per Sharon patient's foster care worker will follow up with the mother.  CSW made nurse aware that mother and infant can discharge home today. Please reconsult if future social work needs arise. CSW signing off.   Samit Sylve, LCSW [phone]  

## 2016-10-26 NOTE — Progress Notes (Signed)
Pt and family watching period of purple cry video.

## 2016-10-27 NOTE — Progress Notes (Signed)
Clinical Social Worker (CSW) received a call on 10/27/16 from mother's foster care worker [place] [phone]. CSW made Jessica aware that mother and infant discharged home on 10/26/16. Per Jessica there are no concerns and mother is working on reunification with her other 2 children currently in foster care. Per Jessica the foster parent brought the other 2 children to [facility] to meet the infant. CSW made Jessica aware that [place] CPS screened the case out.   Heena Woodbury, LCSW [phone] 

## 2016-12-03 LAB — HM HIV SCREENING LAB: HM HIV Screening: NEGATIVE

## 2017-01-29 IMAGING — US US OB COMP +14 WK
1 series · 13 of 28 positions shown · non-contrast
Comparison: none

CLINICAL DATA: No bottle Doppler heart tones in office.
Inconclusive fetal viability. Gestational age by LMP of 16 weeks 4
days.

EXAM:
OBSTETRICAL ULTRASOUND >14 WKS

[Series 1: us ob comp +14 wk · 0.28mm/px · 13 of 51 slices shown]
[im 2/51]
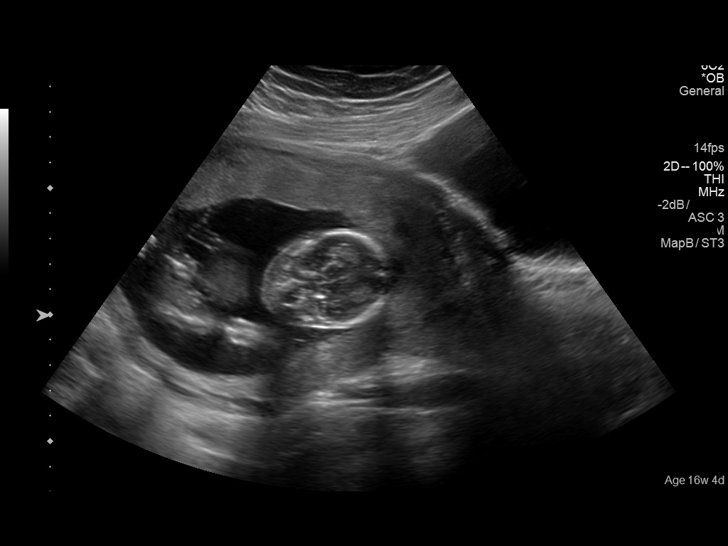
[im 6/51]
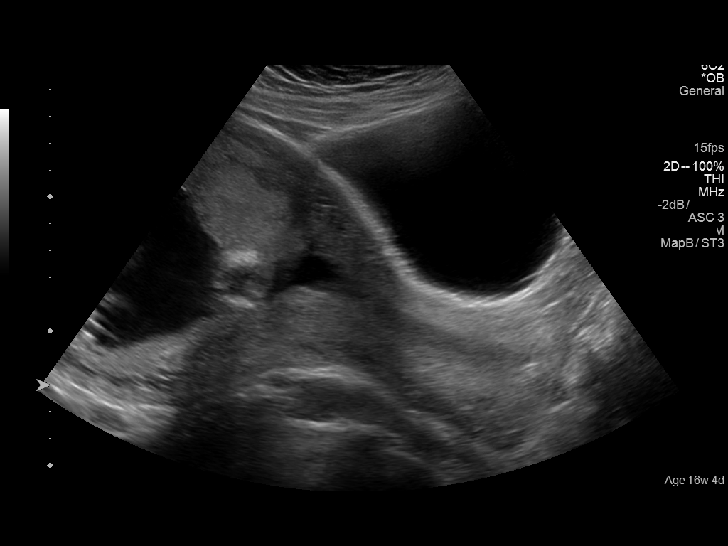
[im 10/51]
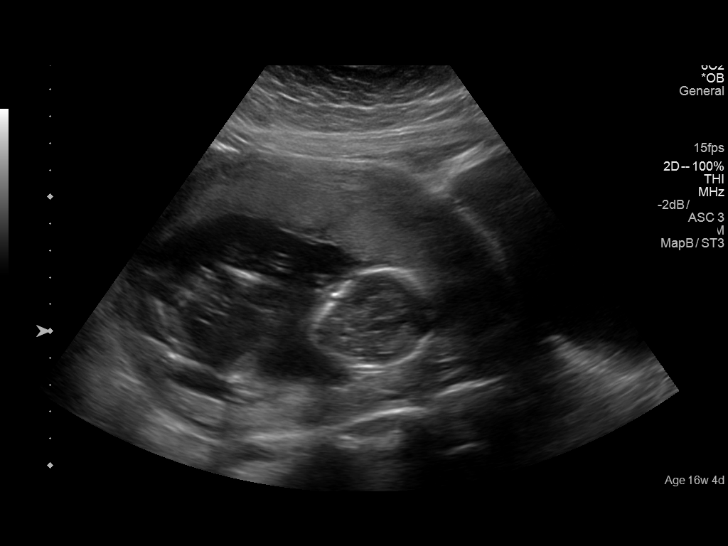
[im 13/51]
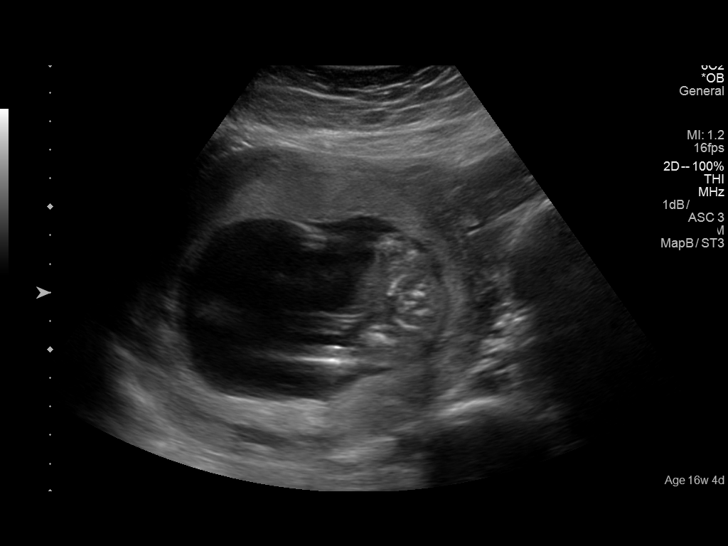
[im 17/51]
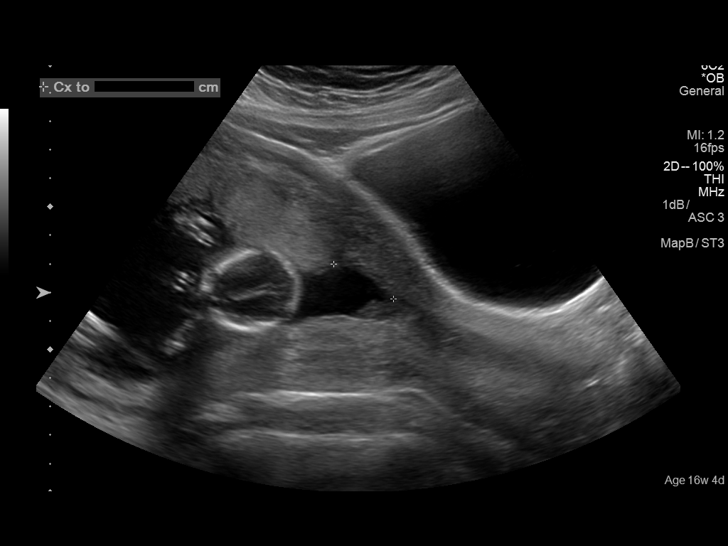
[im 21/51]
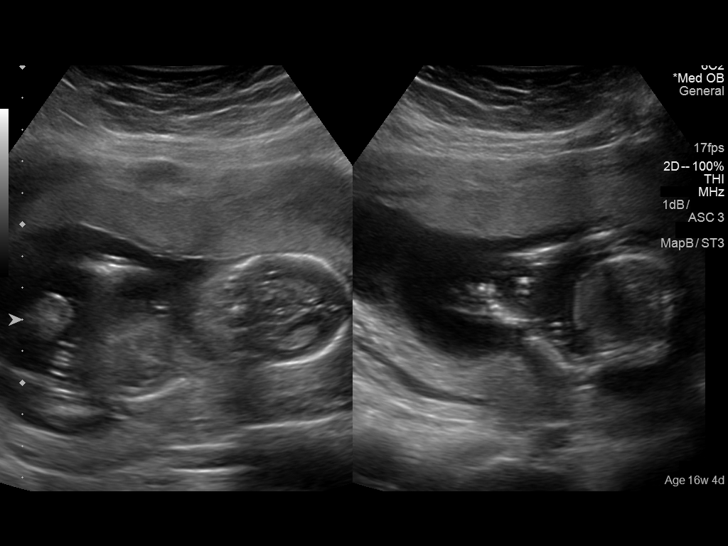
[im 26/51]
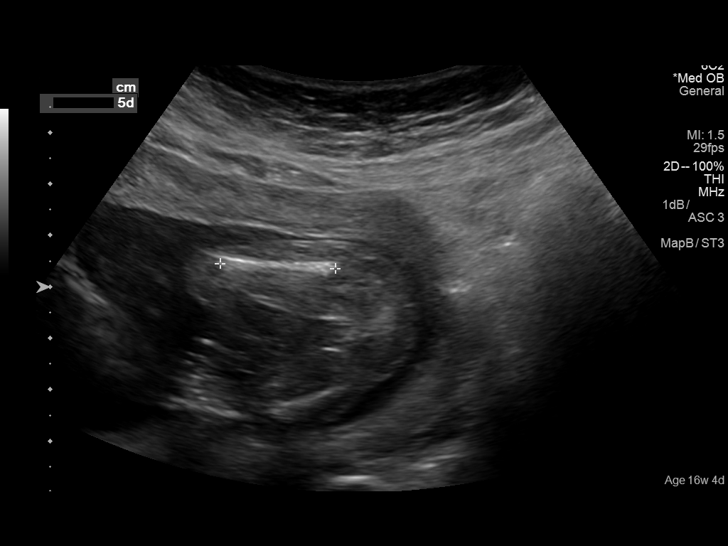
[im 30/51]
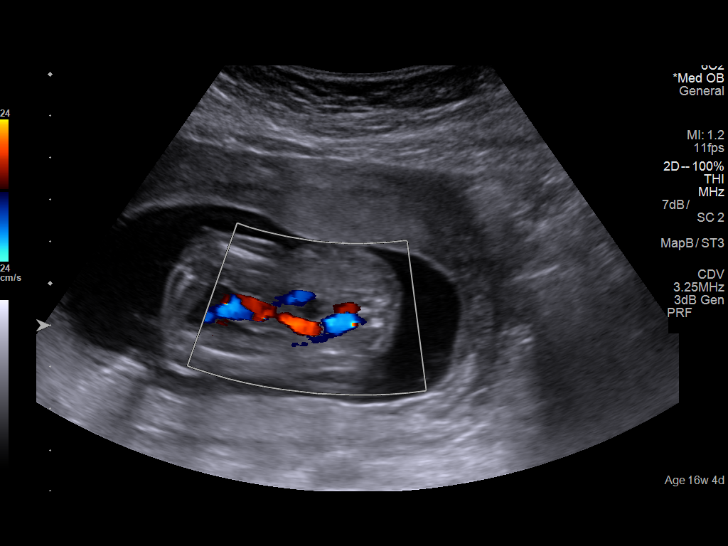
[im 34/51]
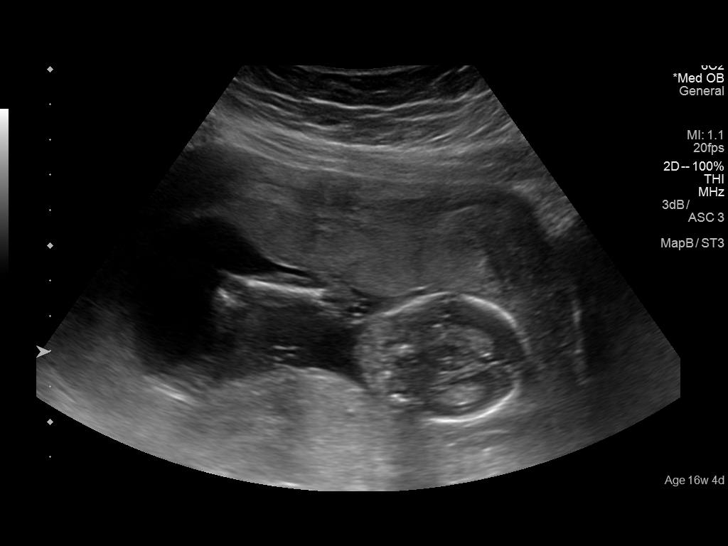
[im 38/51]
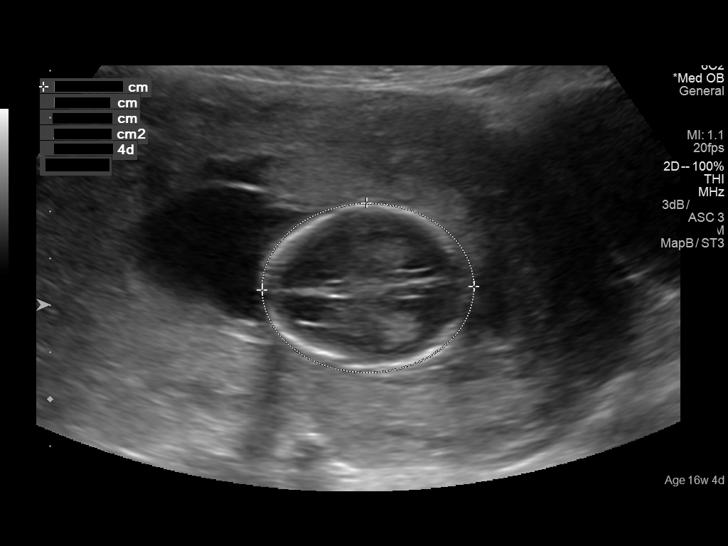
[im 41/51]
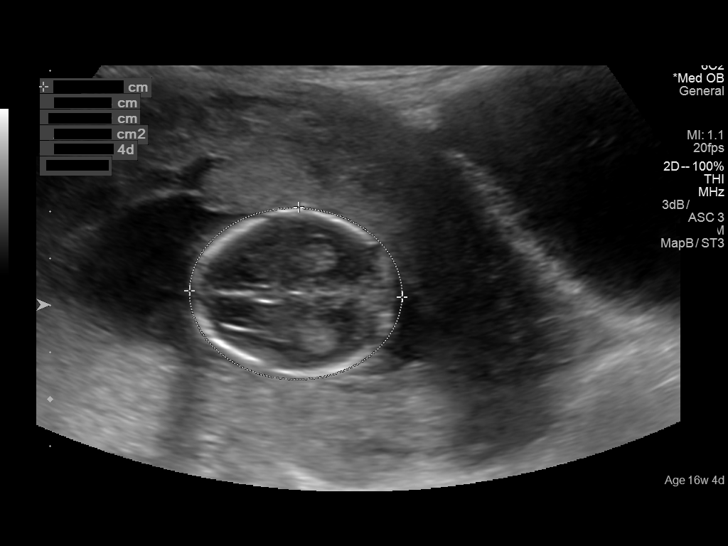
[im 45/51]
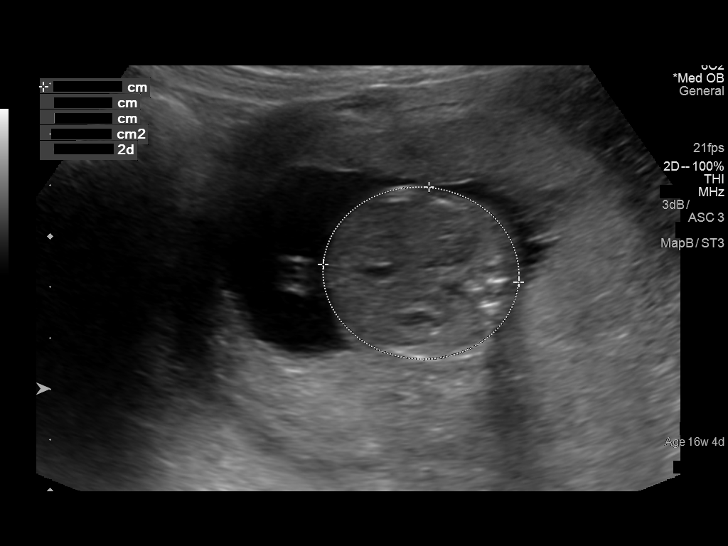
[im 49/51]
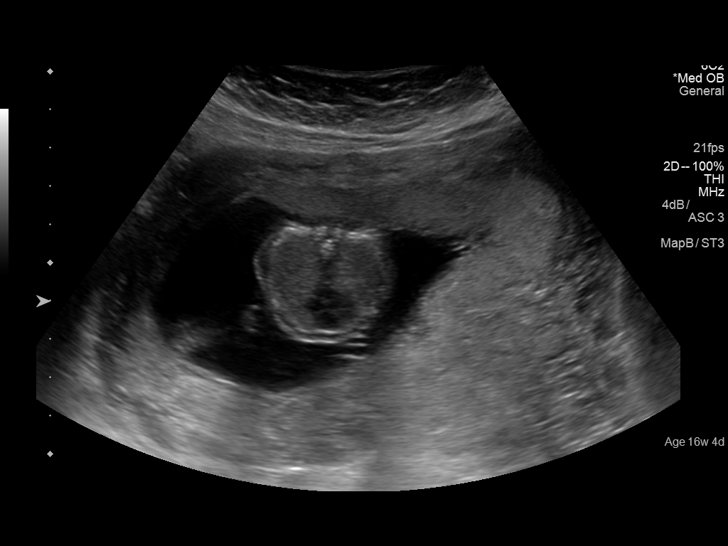

[13 of 28 positions shown; findings below may reference images not displayed]

FINDINGS: Number of Fetuses: 1

Heart Rate:  168 bpm

Movement: Yes

Presentation: Cephalic

Previa: No

Placental Location: Anterior

Amniotic Fluid (Subjective): Within normal limits

Amniotic Fluid (Objective):

Vertical pocket 6.9cm

FETAL BIOMETRY

BPD:  3.5cm 16w 5d

HC:    12.9cm  16w   4d

AC:   11.5cm  17w   2d

FL:   2.3cm  16w   5d

Current Mean GA: 16w 6d              US EDC: 10/22/2016

FETAL ANATOMY

Lateral Ventricles: Limited views appear normal

Thalami/CSP: Limited views appear normal

Posterior Fossa:  Not visualized

Nuchal Region: Not visualized

Upper Lip: Not visualized

Spine: Not visualized

4 Chamber Heart on Left: Not visualized

LVOT: Not visualized

RVOT: Not visualized

Stomach on Left: Appears normal

3 Vessel Cord: Appears normal

Cord Insertion site: Not visualized

Kidneys: Not visualized

Bladder: Appears normal

Extremities: 4 extremities visualized

Sex: Not visualized

Technically difficult due to: Early gestational age

Maternal Findings:

Cervix:  4.8 cm transabdominally
IMPRESSION: Single living IUP with estimated gestational age of 16 weeks 6 days,
an US EDC of 10/22/2016. This is concordant with given LMP.

Limited fetal anatomic evaluation due to early gestational age. No
early fetal anomalies identified. Recommend follow-up OB ultrasound
in 2-3 weeks for complete fetal anatomic evaluation.

## 2018-08-17 IMAGING — CR DG CHEST 2V
2 series · 2 of 2 positions shown · non-contrast
Comparison: None.

CLINICAL DATA: Wheezing, cough.  History of asthma.

EXAM:
CHEST  2 VIEW

[chest pa]
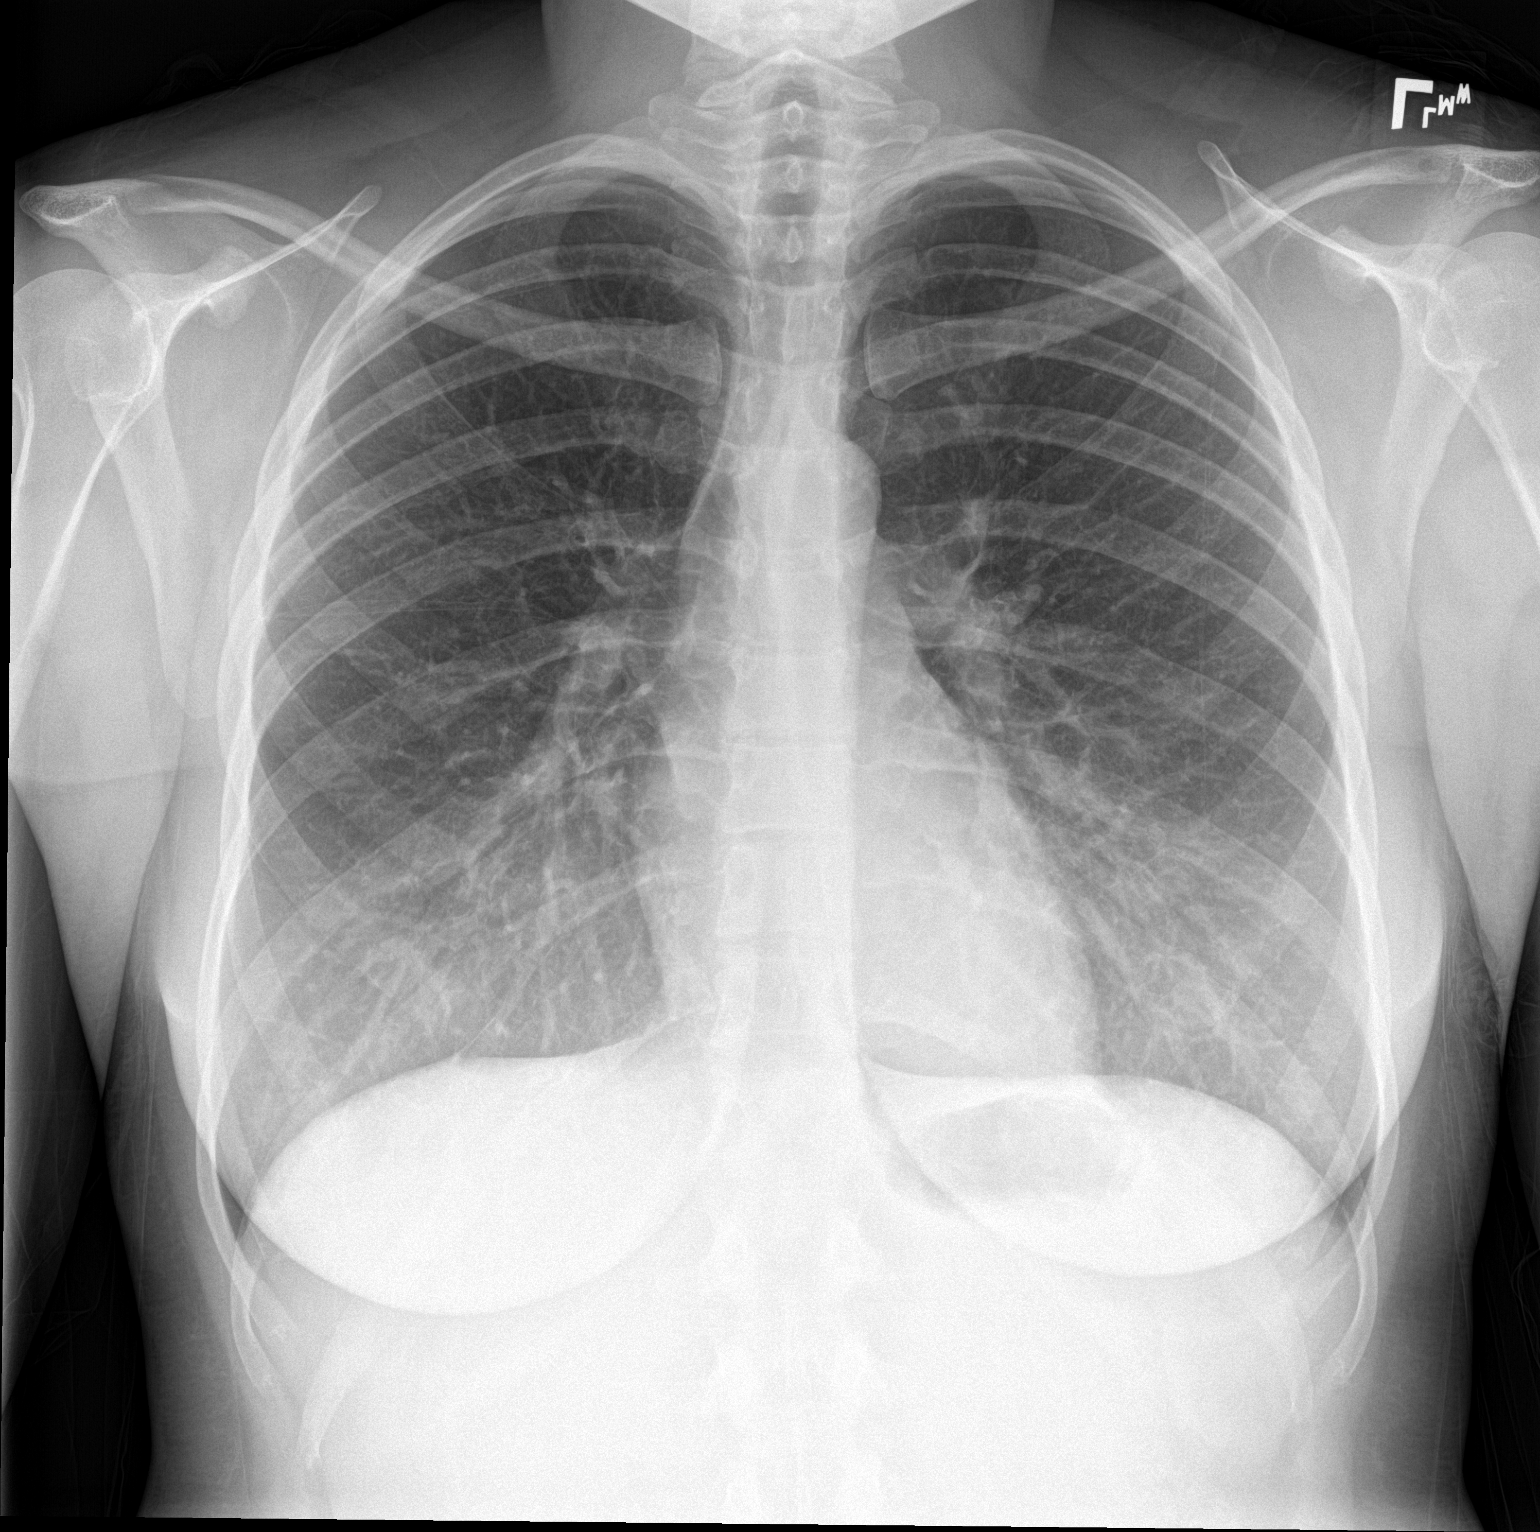

[chest lat]
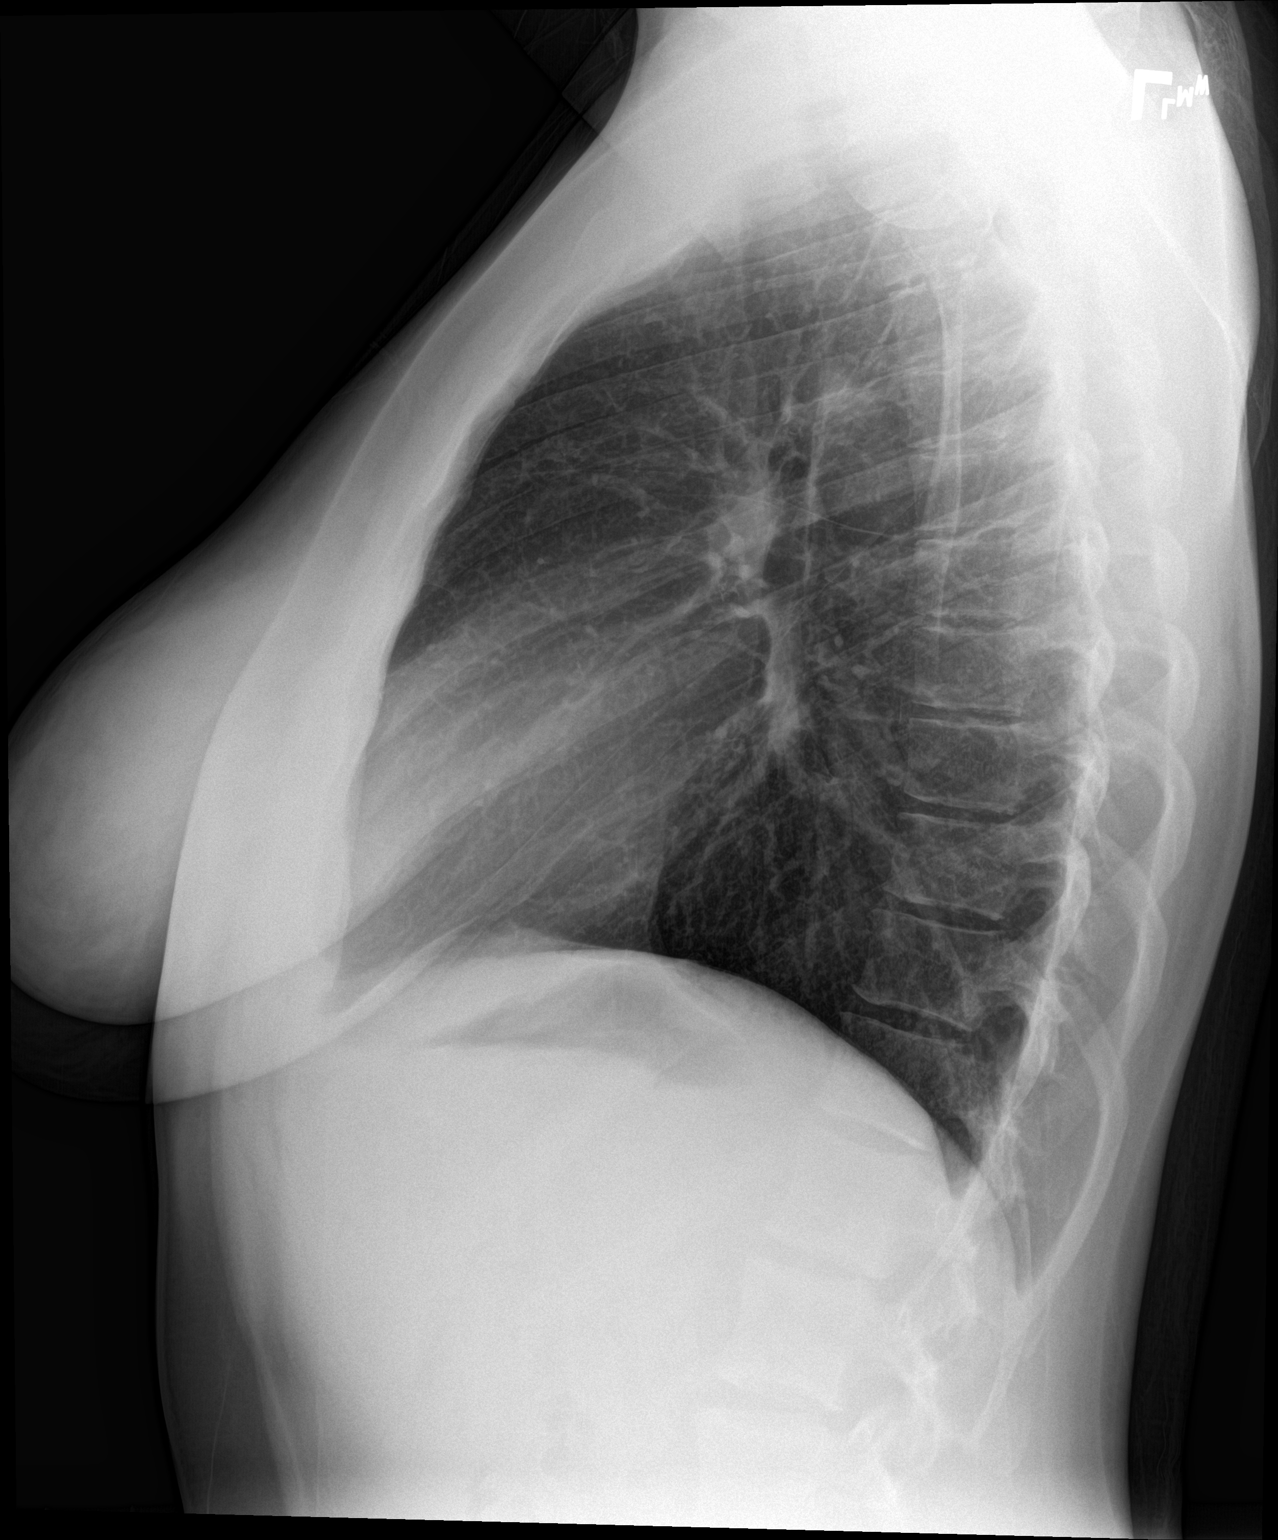

[2 of 2 positions shown; findings below may reference images not displayed]

FINDINGS: The cardiomediastinal contours are normal. The lungs are clear.
Pulmonary vasculature is normal. No consolidation, pleural effusion,
or pneumothorax. No evidence of pneumomediastinum. No acute osseous
abnormalities are seen.
IMPRESSION: No active cardiopulmonary disease.

## 2019-01-26 DIAGNOSIS — Z8751 Personal history of pre-term labor: Secondary | ICD-10-CM

## 2019-01-26 DIAGNOSIS — F324 Major depressive disorder, single episode, in partial remission: Secondary | ICD-10-CM

## 2019-01-29 ENCOUNTER — Ambulatory Visit: Payer: Self-pay

## 2019-06-08 ENCOUNTER — Ambulatory Visit (INDEPENDENT_AMBULATORY_CARE_PROVIDER_SITE_OTHER): Payer: Self-pay | Admitting: Obstetrics and Gynecology

## 2019-06-08 ENCOUNTER — Encounter: Payer: Self-pay | Admitting: Obstetrics and Gynecology

## 2019-06-08 ENCOUNTER — Other Ambulatory Visit: Payer: Self-pay

## 2019-06-08 VITALS — BP 128/74 | Ht 69.0 in | Wt 242.0 lb

## 2019-06-08 DIAGNOSIS — N921 Excessive and frequent menstruation with irregular cycle: Secondary | ICD-10-CM

## 2019-06-08 DIAGNOSIS — T8332XA Displacement of intrauterine contraceptive device, initial encounter: Secondary | ICD-10-CM
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
## 2019-07-09 ENCOUNTER — Ambulatory Visit: Payer: Medicaid Other | Admitting: Obstetrics and Gynecology
